# Patient Record
Sex: Female | Born: 1945 | Race: Black or African American | Hispanic: No | Smoking: Never smoker
Health system: Southern US, Community
[De-identification: ages and names within clinical notes are randomized; demographics above are authoritative.]

---
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
## 1988-06-30 HISTORY — PX: BREAST EXCISIONAL BIOPSY: SUR124

## 1994-02-25 HISTORY — PX: BREAST EXCISIONAL BIOPSY: SUR124

## 1998-07-27 ENCOUNTER — Ambulatory Visit (HOSPITAL_BASED_OUTPATIENT_CLINIC_OR_DEPARTMENT_OTHER): Admission: RE | Admit: 1998-07-27 | Discharge: 1998-07-27 | Payer: Self-pay | Admitting: Specialist

## 1998-11-05 ENCOUNTER — Ambulatory Visit (HOSPITAL_COMMUNITY): Admission: RE | Admit: 1998-11-05 | Discharge: 1998-11-05 | Payer: Self-pay | Admitting: Gastroenterology

## 1999-04-11 ENCOUNTER — Encounter: Admission: RE | Admit: 1999-04-11 | Discharge: 1999-04-11 | Payer: Self-pay | Admitting: *Deleted

## 1999-04-15 ENCOUNTER — Encounter: Admission: RE | Admit: 1999-04-15 | Discharge: 1999-04-15 | Payer: Self-pay | Admitting: *Deleted

## 1999-10-14 ENCOUNTER — Encounter: Admission: RE | Admit: 1999-10-14 | Discharge: 1999-10-14 | Payer: Self-pay | Admitting: *Deleted

## 1999-10-14 ENCOUNTER — Encounter: Payer: Self-pay | Admitting: Obstetrics and Gynecology

## 2000-04-21 ENCOUNTER — Encounter: Admission: RE | Admit: 2000-04-21 | Discharge: 2000-04-21 | Payer: Self-pay | Admitting: General Surgery

## 2000-04-21 ENCOUNTER — Encounter (HOSPITAL_BASED_OUTPATIENT_CLINIC_OR_DEPARTMENT_OTHER): Payer: Self-pay | Admitting: General Surgery

## 2001-04-22 ENCOUNTER — Encounter: Payer: Self-pay | Admitting: Family Medicine

## 2001-04-22 ENCOUNTER — Encounter: Admission: RE | Admit: 2001-04-22 | Discharge: 2001-04-22 | Payer: Self-pay | Admitting: Family Medicine

## 2001-06-14 ENCOUNTER — Other Ambulatory Visit: Admission: RE | Admit: 2001-06-14 | Discharge: 2001-06-14 | Payer: Self-pay | Admitting: Family Medicine

## 2001-06-25 ENCOUNTER — Encounter: Payer: Self-pay | Admitting: Family Medicine

## 2001-06-25 ENCOUNTER — Encounter: Admission: RE | Admit: 2001-06-25 | Discharge: 2001-06-25 | Payer: Self-pay | Admitting: Family Medicine

## 2002-04-25 ENCOUNTER — Encounter: Payer: Self-pay | Admitting: Family Medicine

## 2002-04-25 ENCOUNTER — Encounter: Admission: RE | Admit: 2002-04-25 | Discharge: 2002-04-25 | Payer: Self-pay | Admitting: Family Medicine

## 2002-06-15 ENCOUNTER — Other Ambulatory Visit: Admission: RE | Admit: 2002-06-15 | Discharge: 2002-06-15 | Payer: Self-pay | Admitting: Family Medicine

## 2003-04-28 ENCOUNTER — Encounter: Admission: RE | Admit: 2003-04-28 | Discharge: 2003-04-28 | Payer: Self-pay | Admitting: Family Medicine

## 2003-10-02 ENCOUNTER — Other Ambulatory Visit: Admission: RE | Admit: 2003-10-02 | Discharge: 2003-10-02 | Payer: Self-pay | Admitting: Family Medicine

## 2004-05-16 ENCOUNTER — Encounter: Admission: RE | Admit: 2004-05-16 | Discharge: 2004-05-16 | Payer: Self-pay | Admitting: Family Medicine

## 2004-10-17 ENCOUNTER — Other Ambulatory Visit: Admission: RE | Admit: 2004-10-17 | Discharge: 2004-10-17 | Payer: Self-pay | Admitting: Family Medicine

## 2004-10-31 ENCOUNTER — Encounter: Admission: RE | Admit: 2004-10-31 | Discharge: 2004-10-31 | Payer: Self-pay | Admitting: Family Medicine

## 2005-06-26 ENCOUNTER — Encounter: Admission: RE | Admit: 2005-06-26 | Discharge: 2005-06-26 | Payer: Self-pay | Admitting: Family Medicine

## 2006-07-01 ENCOUNTER — Encounter: Admission: RE | Admit: 2006-07-01 | Discharge: 2006-07-01 | Payer: Self-pay | Admitting: Family Medicine

## 2007-07-26 ENCOUNTER — Encounter: Admission: RE | Admit: 2007-07-26 | Discharge: 2007-07-26 | Payer: Self-pay | Admitting: Family Medicine

## 2008-07-28 ENCOUNTER — Encounter: Admission: RE | Admit: 2008-07-28 | Discharge: 2008-07-28 | Payer: Self-pay | Admitting: Family Medicine

## 2009-07-30 ENCOUNTER — Encounter: Admission: RE | Admit: 2009-07-30 | Discharge: 2009-07-30 | Payer: Self-pay | Admitting: Family Medicine

## 2010-07-20 ENCOUNTER — Other Ambulatory Visit: Payer: Self-pay | Admitting: Family Medicine

## 2010-07-20 DIAGNOSIS — Z0000 Encounter for general adult medical examination without abnormal findings: Secondary | ICD-10-CM

## 2010-07-21 ENCOUNTER — Encounter: Payer: Self-pay | Admitting: Family Medicine

## 2010-08-01 ENCOUNTER — Ambulatory Visit
Admission: RE | Admit: 2010-08-01 | Discharge: 2010-08-01 | Disposition: A | Discharge status: Home / Self Care | Payer: 59 | Source: Ambulatory Visit | Attending: Family Medicine | Admitting: Family Medicine

## 2010-08-01 DIAGNOSIS — Z0000 Encounter for general adult medical examination without abnormal findings: Secondary | ICD-10-CM

## 2010-08-01 DIAGNOSIS — Z780 Asymptomatic menopausal state: Secondary | ICD-10-CM

## 2010-08-01 DIAGNOSIS — M8580 Other specified disorders of bone density and structure, unspecified site: Secondary | ICD-10-CM

## 2011-07-04 ENCOUNTER — Other Ambulatory Visit: Payer: Self-pay | Admitting: Family Medicine

## 2011-07-04 DIAGNOSIS — Z1231 Encounter for screening mammogram for malignant neoplasm of breast: Secondary | ICD-10-CM

## 2011-08-01 ENCOUNTER — Other Ambulatory Visit: Payer: Self-pay

## 2011-08-05 ENCOUNTER — Ambulatory Visit
Admission: RE | Admit: 2011-08-05 | Discharge: 2011-08-05 | Disposition: A | Discharge status: Home / Self Care | Payer: BLUE CROSS/BLUE SHIELD | Source: Ambulatory Visit | Attending: Family Medicine | Admitting: Family Medicine

## 2011-08-05 DIAGNOSIS — Z1231 Encounter for screening mammogram for malignant neoplasm of breast: Secondary | ICD-10-CM

## 2012-07-16 ENCOUNTER — Other Ambulatory Visit (HOSPITAL_BASED_OUTPATIENT_CLINIC_OR_DEPARTMENT_OTHER): Payer: Self-pay | Admitting: Family Medicine

## 2012-07-16 ENCOUNTER — Other Ambulatory Visit: Payer: Self-pay | Admitting: Family Medicine

## 2012-07-16 DIAGNOSIS — Z1239 Encounter for other screening for malignant neoplasm of breast: Secondary | ICD-10-CM

## 2012-07-16 DIAGNOSIS — Z1231 Encounter for screening mammogram for malignant neoplasm of breast: Secondary | ICD-10-CM

## 2012-08-05 ENCOUNTER — Ambulatory Visit (HOSPITAL_BASED_OUTPATIENT_CLINIC_OR_DEPARTMENT_OTHER): Payer: BLUE CROSS/BLUE SHIELD

## 2012-08-18 ENCOUNTER — Ambulatory Visit
Admission: RE | Admit: 2012-08-18 | Discharge: 2012-08-18 | Disposition: A | Discharge status: Home / Self Care | Payer: Medicare Other | Source: Ambulatory Visit | Attending: Family Medicine | Admitting: Family Medicine

## 2012-08-18 DIAGNOSIS — Z1231 Encounter for screening mammogram for malignant neoplasm of breast: Secondary | ICD-10-CM

## 2012-12-04 DIAGNOSIS — M5137 Other intervertebral disc degeneration, lumbosacral region: Secondary | ICD-10-CM | POA: Insufficient documentation

## 2012-12-04 DIAGNOSIS — M51379 Other intervertebral disc degeneration, lumbosacral region without mention of lumbar back pain or lower extremity pain: Secondary | ICD-10-CM | POA: Insufficient documentation

## 2012-12-04 DIAGNOSIS — IMO0002 Reserved for concepts with insufficient information to code with codable children: Secondary | ICD-10-CM | POA: Insufficient documentation

## 2012-12-04 DIAGNOSIS — M47817 Spondylosis without myelopathy or radiculopathy, lumbosacral region: Secondary | ICD-10-CM | POA: Insufficient documentation

## 2013-08-09 ENCOUNTER — Other Ambulatory Visit: Payer: Self-pay | Admitting: Family Medicine

## 2013-08-09 DIAGNOSIS — Z1239 Encounter for other screening for malignant neoplasm of breast: Secondary | ICD-10-CM

## 2013-08-26 ENCOUNTER — Ambulatory Visit: Payer: Medicare Other

## 2013-09-09 ENCOUNTER — Ambulatory Visit
Admission: RE | Admit: 2013-09-09 | Discharge: 2013-09-09 | Disposition: A | Discharge status: Home / Self Care | Payer: Medicare HMO | Source: Ambulatory Visit | Attending: Family Medicine | Admitting: Family Medicine

## 2013-09-09 DIAGNOSIS — Z1239 Encounter for other screening for malignant neoplasm of breast: Secondary | ICD-10-CM

## 2013-10-24 ENCOUNTER — Encounter: Payer: Self-pay | Admitting: Podiatry

## 2013-10-24 ENCOUNTER — Ambulatory Visit (INDEPENDENT_AMBULATORY_CARE_PROVIDER_SITE_OTHER): Payer: Medicare HMO | Admitting: Podiatry

## 2013-10-24 VITALS — BP 150/89 | HR 98 | Ht 63.0 in | Wt 182.0 lb

## 2013-10-24 DIAGNOSIS — M722 Plantar fascial fibromatosis: Secondary | ICD-10-CM

## 2013-10-24 DIAGNOSIS — M659 Synovitis and tenosynovitis, unspecified: Secondary | ICD-10-CM

## 2013-10-24 DIAGNOSIS — M7752 Other enthesopathy of left foot: Secondary | ICD-10-CM

## 2013-10-24 DIAGNOSIS — M21969 Unspecified acquired deformity of unspecified lower leg: Secondary | ICD-10-CM

## 2013-10-24 DIAGNOSIS — M6588 Other synovitis and tenosynovitis, other site: Secondary | ICD-10-CM | POA: Insufficient documentation

## 2013-10-24 NOTE — Patient Instructions (Signed)
Seen for pain in left heel and right medial ankle. Noted of weakened first Metatarsal bone. Casted for Orthotics. Use Arch binder during exercise.

## 2013-10-24 NOTE — Progress Notes (Signed)
Subjective: 68 year old female patient presents stating that she believes she has had plantar fasciitis right ( for about a month or longer) that was improved with exercise. Now there is discomfort in left medial ankle, anterior and inferior aspect of left ankle. Meloxicam helped a little and still having pain after Gym exercise. The right heel pain returned now.  Does exercise daily doing cardio, elliptical and others.   Objective: Dermatologic: Normal skin without any lesions. Vascular: All pedal pulses are palpable.  Orthopedic: hypermobile first ray bilateral. HAV with bunion left. S/P Bunion surgery right. Neurologic: All epicritic and tactile sensations grossly intact.  Radiographic examination reveal post bunion surgery with internal fixation pin in place on left bunion site. Noted of elevated first ray bilateral, large plantar heel spur bilateral.  Assessment: 1. Right plantar fasciitis. 2. Posterior tibial tendonitis left. 3. Hypermobile first ray bilateral. 4. HAV with bunion left.   Plan: Reviewed findings and available treatment options.  Metatarsal binder (Med) dispensed bilateral. Both feet casted for orthotics.

## 2013-11-09 ENCOUNTER — Encounter: Payer: Self-pay | Admitting: Podiatry

## 2013-11-09 ENCOUNTER — Ambulatory Visit (INDEPENDENT_AMBULATORY_CARE_PROVIDER_SITE_OTHER): Payer: Medicare HMO | Admitting: Podiatry

## 2013-11-09 VITALS — BP 151/89 | HR 84

## 2013-11-09 DIAGNOSIS — M722 Plantar fascial fibromatosis: Secondary | ICD-10-CM

## 2013-11-09 NOTE — Patient Instructions (Signed)
Orthotic dispensed and reviewed x-ray findings. Return in one month for follow up.

## 2013-11-09 NOTE — Progress Notes (Signed)
Patient came in to pick up orthotics.  Orthotic dispensed with instruction and x-ray findings reviewed. Return in one month.

## 2013-12-13 ENCOUNTER — Ambulatory Visit (INDEPENDENT_AMBULATORY_CARE_PROVIDER_SITE_OTHER): Payer: Medicare HMO | Admitting: Podiatry

## 2013-12-13 ENCOUNTER — Encounter: Payer: Self-pay | Admitting: Podiatry

## 2013-12-13 VITALS — BP 143/80 | HR 76

## 2013-12-13 DIAGNOSIS — M722 Plantar fascial fibromatosis: Secondary | ICD-10-CM

## 2013-12-13 DIAGNOSIS — M21969 Unspecified acquired deformity of unspecified lower leg: Secondary | ICD-10-CM

## 2013-12-13 NOTE — Patient Instructions (Signed)
Seen for continuing pain in right heel while using Orthotics. Ankle brace dispensed. Return as needed.

## 2013-12-13 NOTE — Progress Notes (Signed)
Subjective:  Came in for Orthotic follow up. Left foot feels fine and has no discomfort in ankle area. Right heel still hurt after been on them. Was out in beach and walked a lot.   Objective: Extreme hypermobile first ray right. Pain in right plantar heel.  Assessment: Plantar fasciitis right. Hypermobile first ray right.   Plan: May benefit from Ankle brace to assist medial column till heel pain subside.  Right Ankle brace (medium) dispensed.  Return if problem recur.

## 2014-08-17 ENCOUNTER — Other Ambulatory Visit: Payer: Self-pay

## 2014-08-17 DIAGNOSIS — Z1231 Encounter for screening mammogram for malignant neoplasm of breast: Secondary | ICD-10-CM

## 2014-08-29 ENCOUNTER — Ambulatory Visit (INDEPENDENT_AMBULATORY_CARE_PROVIDER_SITE_OTHER): Payer: Medicare HMO

## 2014-08-29 VITALS — BP 155/89 | HR 79 | Resp 18

## 2014-08-29 DIAGNOSIS — L608 Other nail disorders: Secondary | ICD-10-CM | POA: Diagnosis not present

## 2014-08-29 DIAGNOSIS — B351 Tinea unguium: Secondary | ICD-10-CM

## 2014-08-29 DIAGNOSIS — L603 Nail dystrophy: Secondary | ICD-10-CM

## 2014-08-29 MED ORDER — TAVABOROLE 5 % EX SOLN: CUTANEOUS | Status: DC

## 2014-08-29 NOTE — Patient Instructions (Signed)
Onychomycosis/Fungal Toenails  WHAT IS IT? An infection that lies within the keratin of your nail plate that is caused by a fungus.  WHY ME? Fungal infections affect all ages, sexes, races, and creeds.  There may be many factors that predispose you to a fungal infection such as age, coexisting medical conditions such as diabetes, or an autoimmune disease; stress, medications, fatigue, genetics, etc.  Bottom line: fungus thrives in a warm, moist environment and your shoes offer such a location.  IS IT CONTAGIOUS? Theoretically, yes.  You do not want to share shoes, nail clippers or files with someone who has fungal toenails.  Walking around barefoot in the same room or sleeping in the same bed is unlikely to transfer the organism.  It is important to realize, however, that fungus can spread easily from one nail to the next on the same foot.  HOW DO WE TREAT THIS?  There are several ways to treat this condition.  Treatment may depend on many factors such as age, medications, pregnancy, liver and kidney conditions, etc.  It is best to ask your doctor which options are available to you.  1. No treatment.   Unlike many other medical concerns, you can live with this condition.  However for many people this can be a painful condition and may lead to ingrown toenails or a bacterial infection.  It is recommended that you keep the nails cut short to help reduce the amount of fungal nail. 2. Topical treatment.  These range from herbal remedies to prescription strength nail lacquers.  About 40-50% effective, topicals require twice daily application for approximately 9 to 12 months or until an entirely new nail has grown out.  The most effective topicals are medical grade medications available through physicians offices. 3. Oral antifungal medications.  With an 80-90% cure rate, the most common oral medication requires 3 to 4 months of therapy and stays in your system for a year as the new nail grows out.  Oral  antifungal medications do require blood work to make sure it is a safe drug for you.  A liver function panel will be performed prior to starting the medication and after the first month of treatment.  It is important to have the blood work performed to avoid any harmful side effects.  In general, this medication safe but blood work is required. 4. Laser Therapy.  This treatment is performed by applying a specialized laser to the affected nail plate.  This therapy is noninvasive, fast, and non-painful.  It is not covered by insurance and is therefore, out of pocket.  The results have been very good with a 80-95% cure rate.  The [facility] is the only practice in the area to offer this therapy. 5. Permanent Nail Avulsion.  Removing the entire nail so that a new nail will not grow back.   Jodi GeraldsKerydin has been ordered from [facility]. It will be sent directly to your home. Begin daily application to each affected toenail once daily for 12 months as instructed.

## 2014-08-29 NOTE — Progress Notes (Signed)
   Subjective:    Patient ID: Mertie MooresVirginia C Yeakel, female    DOB: 1946/06/21, 69 y.o.   MRN: [id number]  HPI I HAVE SOME TOENAILS THAT I WOULD LIKE TO HAVE LOOKED AT AND THE GREAT TOENAIL ON MY RIGHT IS THICK AND DISCOLORED AND IS SORE AND TENDER AND THE 5TH TOE IS THICK AND DISCOLORED AND RUBS AGAINST MY SHOES    Review of Systems  All other systems reviewed and are negative.      Objective:   Physical Exam Neurovascular status is intact with pedal pulses palpable DP and PT +2 over 4 Refill time 3 seconds all digits epicritic and proprioceptive sensations intact there is normal plantar response and DTRs. Dermatologically skin color pigment normal hair growth absent there is dark discoloration of nails of the right hallux and fourth toe right foot show some thickening discoloration and darkening consistent with onychomycosis or separation from the nailbed lysis of the nail from the nailbed on the hallux more so than the fourth digit. No history of acute trauma however has had multiple injuries to the nails over the past rubbing against her shoes. No open wounds no ulcers no secondary infections. No ascending psoas lymphangitis noted again pedal pulses palpable epicritic and sensations intact. Patient has a history of previous bunion surgery right foot bunion hammertoe surgery with been successful correction good range of motion clinically radiated clinically patient is good alignment of the toes no significant deformities are noted at this time.      Assessment & Plan:  Assessment this time nail dystrophy slightly onychomycosis of the hallux and fourth digit right foot. Plan at this time nails are cocoa views are obtained for fungal culture KOH and stains at this time will initiate topical antifungal therapy prescription for Jodi GeraldsKerydin [facility]. Patient will initiate daily Kerydin application for 12 months as instructed suggest a 6 month follow-up and recheck.  Alvan Dameichard Kenika Sahm  DPM

## 2014-08-29 NOTE — Addendum Note (Signed)
Addended by: Hadley PenOX, Jemina Scahill R on: 08/29/2014 10:09 AM   Modules accepted: Orders

## 2014-09-12 ENCOUNTER — Ambulatory Visit
Admission: RE | Admit: 2014-09-12 | Discharge: 2014-09-12 | Disposition: A | Discharge status: Home / Self Care | Payer: Medicare HMO | Source: Ambulatory Visit

## 2014-09-12 DIAGNOSIS — Z1231 Encounter for screening mammogram for malignant neoplasm of breast: Secondary | ICD-10-CM

## 2014-10-05 DIAGNOSIS — J302 Other seasonal allergic rhinitis: Secondary | ICD-10-CM | POA: Insufficient documentation

## 2014-11-08 DIAGNOSIS — Z0000 Encounter for general adult medical examination without abnormal findings: Secondary | ICD-10-CM | POA: Insufficient documentation

## 2014-11-08 DIAGNOSIS — R3129 Other microscopic hematuria: Secondary | ICD-10-CM | POA: Insufficient documentation

## 2014-12-08 DIAGNOSIS — N952 Postmenopausal atrophic vaginitis: Secondary | ICD-10-CM | POA: Insufficient documentation

## 2014-12-08 DIAGNOSIS — N281 Cyst of kidney, acquired: Secondary | ICD-10-CM | POA: Insufficient documentation

## 2014-12-25 ENCOUNTER — Other Ambulatory Visit: Payer: Self-pay

## 2015-03-06 ENCOUNTER — Ambulatory Visit: Payer: Medicare HMO

## 2015-03-06 ENCOUNTER — Ambulatory Visit: Payer: Medicare HMO | Admitting: Podiatry

## 2015-08-07 ENCOUNTER — Other Ambulatory Visit: Payer: Self-pay

## 2015-08-07 DIAGNOSIS — Z1231 Encounter for screening mammogram for malignant neoplasm of breast: Secondary | ICD-10-CM

## 2015-09-13 ENCOUNTER — Ambulatory Visit
Admission: RE | Admit: 2015-09-13 | Discharge: 2015-09-13 | Disposition: A | Discharge status: Home / Self Care | Payer: Medicare HMO | Source: Ambulatory Visit

## 2015-09-13 DIAGNOSIS — Z1231 Encounter for screening mammogram for malignant neoplasm of breast: Secondary | ICD-10-CM

## 2016-08-05 ENCOUNTER — Other Ambulatory Visit: Payer: Self-pay | Admitting: Family Medicine

## 2016-08-05 DIAGNOSIS — Z1231 Encounter for screening mammogram for malignant neoplasm of breast: Secondary | ICD-10-CM

## 2016-09-15 ENCOUNTER — Ambulatory Visit: Payer: Medicare HMO

## 2016-09-17 ENCOUNTER — Ambulatory Visit
Admission: RE | Admit: 2016-09-17 | Discharge: 2016-09-17 | Disposition: A | Discharge status: Home / Self Care | Payer: Medicare HMO | Source: Ambulatory Visit | Attending: Family Medicine | Admitting: Family Medicine

## 2016-09-17 DIAGNOSIS — Z1231 Encounter for screening mammogram for malignant neoplasm of breast: Secondary | ICD-10-CM

## 2016-10-01 DIAGNOSIS — R739 Hyperglycemia, unspecified: Secondary | ICD-10-CM | POA: Insufficient documentation

## 2017-08-20 ENCOUNTER — Other Ambulatory Visit: Payer: Self-pay | Admitting: Family Medicine

## 2017-08-20 DIAGNOSIS — Z139 Encounter for screening, unspecified: Secondary | ICD-10-CM

## 2017-09-18 ENCOUNTER — Ambulatory Visit
Admission: RE | Admit: 2017-09-18 | Discharge: 2017-09-18 | Disposition: A | Discharge status: Home / Self Care | Payer: Medicare HMO | Source: Ambulatory Visit | Attending: Family Medicine | Admitting: Family Medicine

## 2017-09-18 DIAGNOSIS — Z139 Encounter for screening, unspecified: Secondary | ICD-10-CM

## 2017-12-16 DIAGNOSIS — S92302A Fracture of unspecified metatarsal bone(s), left foot, initial encounter for closed fracture: Secondary | ICD-10-CM | POA: Insufficient documentation

## 2018-01-11 DIAGNOSIS — M21612 Bunion of left foot: Secondary | ICD-10-CM | POA: Insufficient documentation

## 2018-07-27 ENCOUNTER — Encounter: Payer: Self-pay | Admitting: Podiatry

## 2018-07-27 ENCOUNTER — Ambulatory Visit (INDEPENDENT_AMBULATORY_CARE_PROVIDER_SITE_OTHER): Payer: Medicare HMO

## 2018-07-27 ENCOUNTER — Other Ambulatory Visit: Payer: Self-pay | Admitting: Podiatry

## 2018-07-27 ENCOUNTER — Ambulatory Visit: Payer: Medicare HMO | Admitting: Podiatry

## 2018-07-27 VITALS — BP 167/86 | HR 67 | Resp 16

## 2018-07-27 DIAGNOSIS — M2041 Other hammer toe(s) (acquired), right foot: Secondary | ICD-10-CM

## 2018-07-27 DIAGNOSIS — Q828 Other specified congenital malformations of skin: Secondary | ICD-10-CM

## 2018-07-27 DIAGNOSIS — M2042 Other hammer toe(s) (acquired), left foot: Principal | ICD-10-CM

## 2018-07-28 NOTE — Progress Notes (Signed)
  Subjective:  Patient ID: Kendra Jenkins, female    DOB: [birth year],  MRN: [id number] HPI Chief Complaint  Patient presents with  . Toe Pain    2nd toe right - callused area plantar toe x 1-2 weeks, tender, no treatment, previous hammer toe surgery  . New Patient (Initial Visit)    Est pt 2016    73 y.o. female presents with the above complaint.   ROS: Denies fever chills nausea vomiting muscle aches pains calf pain back pain chest pain shortness of breath.  No past medical history on file. Past Surgical History:  Procedure Laterality Date  . BREAST EXCISIONAL BIOPSY Bilateral 02/25/1994  . BREAST EXCISIONAL BIOPSY Left 1990  . BREAST EXCISIONAL BIOPSY Right 1963    Current Outpatient Medications:  .  aspirin EC 81 MG tablet, Take 81 mg by mouth daily. , Disp: , Rfl:  .  Biotin 1 MG CAPS, 5 mg daily. , Disp: , Rfl:  .  Cholecalciferol (VITAMIN D3) 2000 UNITS capsule, Take by mouth., Disp: , Rfl:  .  hydrochlorothiazide (HYDRODIURIL) 12.5 MG tablet, , Disp: , Rfl:  .  losartan (COZAAR) 50 MG tablet, , Disp: , Rfl:   No Known Allergies Review of Systems Objective:   Vitals:   07/27/18 0943  BP: (!) 167/86  Pulse: 67  Resp: 16    General: Well developed, nourished, in no acute distress, alert and oriented x3   Dermatological: Skin is warm, dry and supple bilateral. Nails x 10 are well maintained; remaining integument appears unremarkable at this time. There are no open sores, no preulcerative lesions, no rash or signs of infection present.  A pinch callus resulting from under lapping of the third toe.  The calluses on the second toe lateral aspect with a small porokeratotic lesion.  Does not demonstrate any type of foreign body no purulence no malodor no iatrogenic lesions.  Vascular: Dorsalis Pedis artery and Posterior Tibial artery pedal pulses are 2/4 bilateral with immedate capillary fill time. Pedal hair growth present. No varicosities and no lower extremity edema  present bilateral.   Neruologic: Grossly intact via light touch bilateral. Vibratory intact via tuning fork bilateral. Protective threshold with Semmes Wienstein monofilament intact to all pedal sites bilateral. Patellar and Achilles deep tendon reflexes 2+ bilateral. No Babinski or clonus noted bilateral.   Musculoskeletal: No gross boney pedal deformities bilateral. No pain, crepitus, or limitation noted with foot and ankle range of motion bilateral. Muscular strength 5/5 in all groups tested bilateral.  Gait: Unassisted, Nonantalgic.    Radiographs:  Radiographs taken demonstrate mild hammertoe deformity underlapping of the second toe from the third toe on the right foot.  No acute findings.  Assessment & Plan:   Assessment: A small pinch callus from the third toe on the second toe resulting in a porokeratotic lesion.  Plan: Debrided the reactive hyperkeratotic lesion placed silicone padding discussed appropriate shoe gear with her.  Discussed the possible need for surgical correction of the third fourth and fifth toe.     Kendra Jenkins, [place]

## 2018-08-23 ENCOUNTER — Other Ambulatory Visit: Payer: Self-pay | Admitting: Family Medicine

## 2018-08-23 DIAGNOSIS — Z1231 Encounter for screening mammogram for malignant neoplasm of breast: Secondary | ICD-10-CM

## 2018-09-23 ENCOUNTER — Ambulatory Visit: Payer: Medicare HMO

## 2018-11-03 ENCOUNTER — Ambulatory Visit: Payer: Medicare HMO

## 2018-12-17 ENCOUNTER — Ambulatory Visit
Admission: RE | Admit: 2018-12-17 | Discharge: 2018-12-17 | Disposition: A | Discharge status: Home / Self Care | Payer: Medicare HMO | Source: Ambulatory Visit | Attending: Family Medicine | Admitting: Family Medicine

## 2018-12-17 ENCOUNTER — Other Ambulatory Visit: Payer: Self-pay

## 2018-12-17 DIAGNOSIS — Z1231 Encounter for screening mammogram for malignant neoplasm of breast: Secondary | ICD-10-CM

## 2019-11-15 ENCOUNTER — Other Ambulatory Visit: Payer: Self-pay | Admitting: Family Medicine

## 2019-11-15 DIAGNOSIS — Z1231 Encounter for screening mammogram for malignant neoplasm of breast: Secondary | ICD-10-CM

## 2019-12-20 ENCOUNTER — Other Ambulatory Visit: Payer: Self-pay

## 2019-12-20 ENCOUNTER — Ambulatory Visit
Admission: RE | Admit: 2019-12-20 | Discharge: 2019-12-20 | Disposition: A | Discharge status: Home / Self Care | Payer: Medicare HMO | Source: Ambulatory Visit | Attending: Family Medicine | Admitting: Family Medicine

## 2019-12-20 DIAGNOSIS — Z1231 Encounter for screening mammogram for malignant neoplasm of breast: Secondary | ICD-10-CM

## 2020-11-15 ENCOUNTER — Other Ambulatory Visit: Payer: Self-pay | Admitting: Family Medicine

## 2020-11-15 DIAGNOSIS — Z1231 Encounter for screening mammogram for malignant neoplasm of breast: Secondary | ICD-10-CM

## 2021-01-15 ENCOUNTER — Other Ambulatory Visit: Payer: Self-pay

## 2021-01-15 ENCOUNTER — Ambulatory Visit
Admission: RE | Admit: 2021-01-15 | Discharge: 2021-01-15 | Disposition: A | Discharge status: Home / Self Care | Payer: Medicare HMO | Source: Ambulatory Visit | Attending: Family Medicine | Admitting: Family Medicine

## 2021-01-15 DIAGNOSIS — Z1231 Encounter for screening mammogram for malignant neoplasm of breast: Secondary | ICD-10-CM

## 2021-05-17 IMAGING — MG DIGITAL SCREENING BILATERAL MAMMOGRAM WITH TOMO AND CAD
8 series · 8 of 24 positions shown · non-contrast
Comparison: Previous exam(s).

CLINICAL DATA: Screening.

EXAM:
DIGITAL SCREENING BILATERAL MAMMOGRAM WITH TOMO AND CAD

[R CC synth-2D]
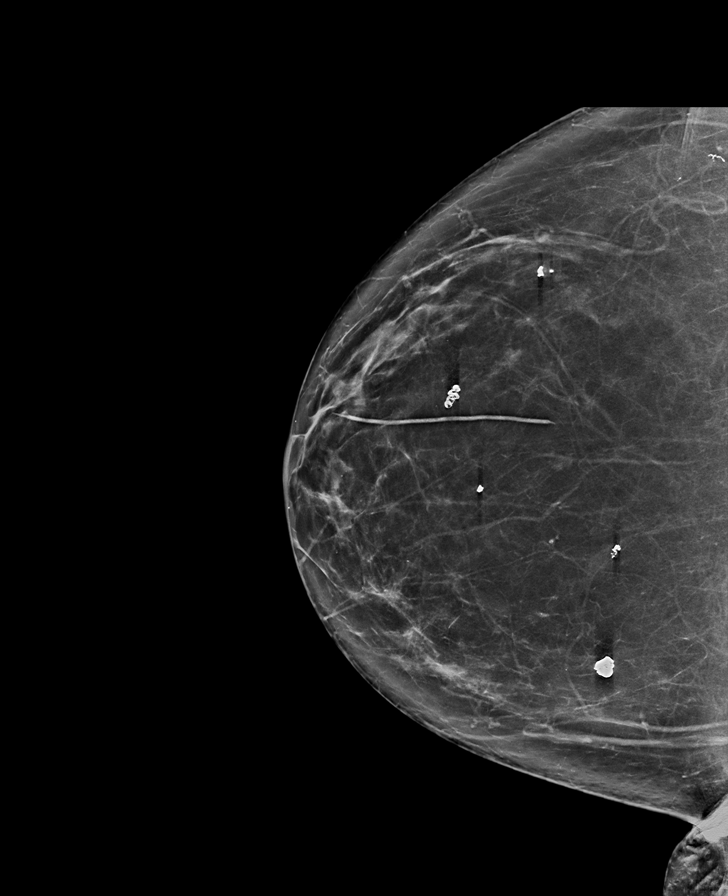

[L CC synth-2D]
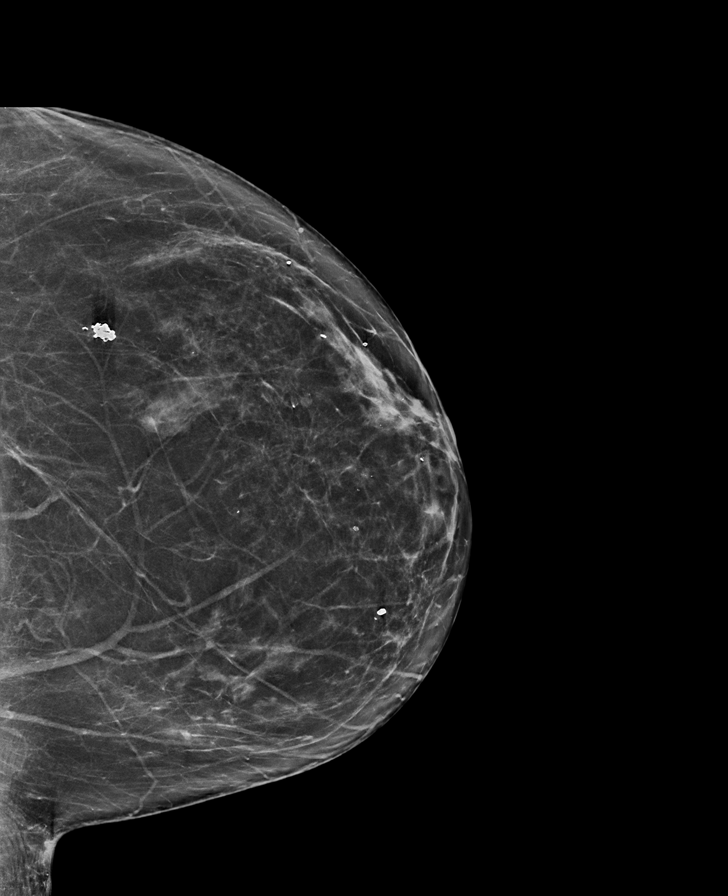

[L MLO synth-2D]
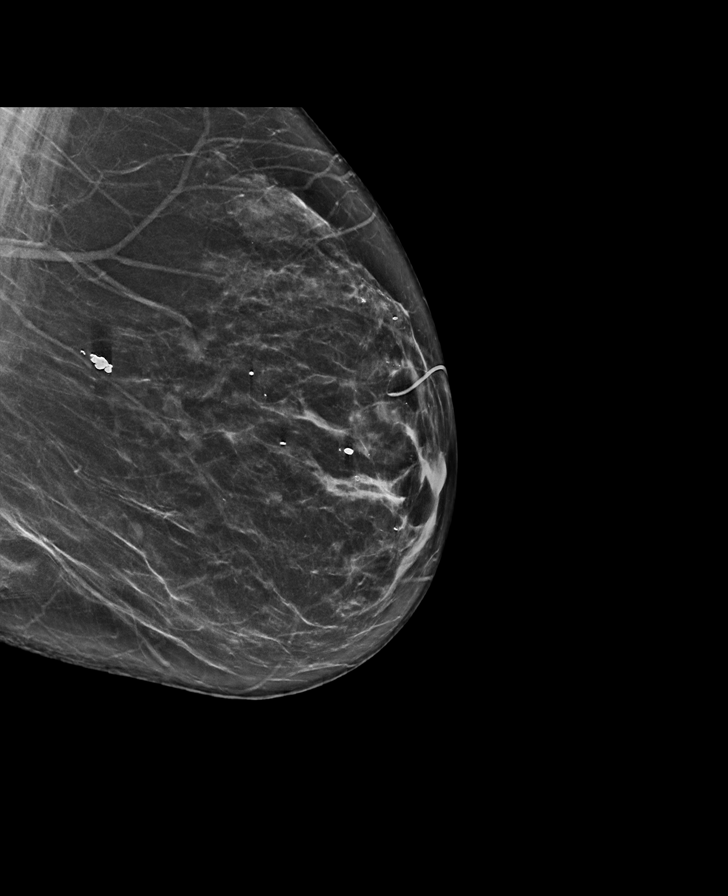

[R MLO synth-2D]
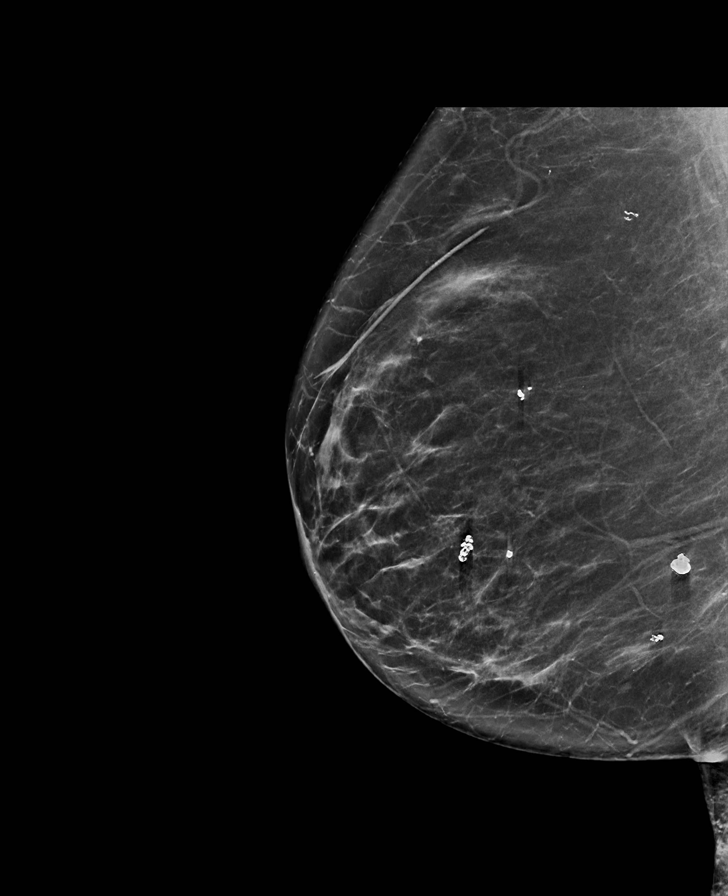

[R CC tomo · tomo slice 41/81.0]
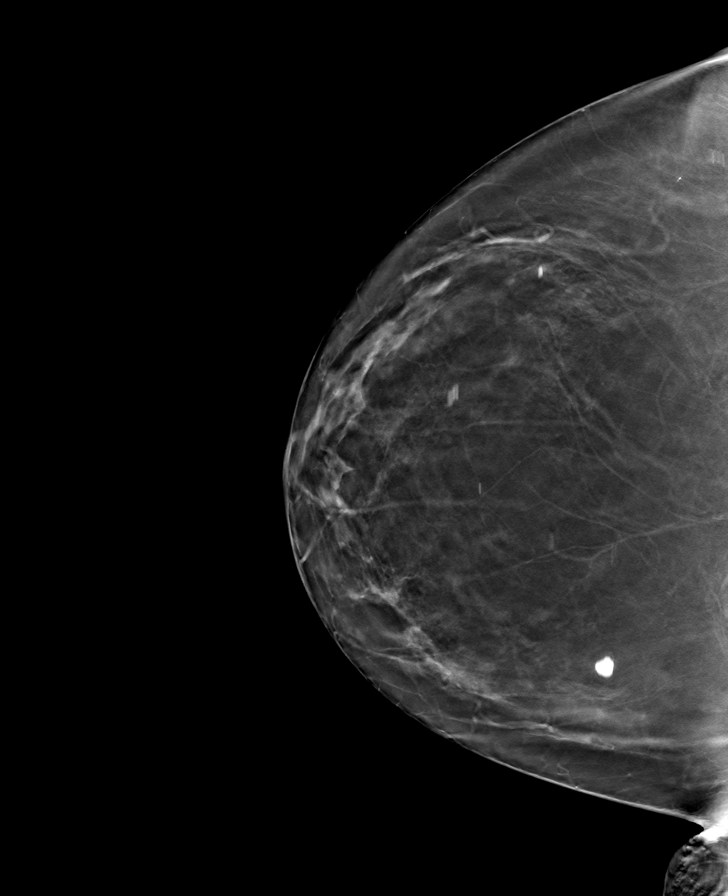

[L MLO tomo · tomo slice 39/76.0]
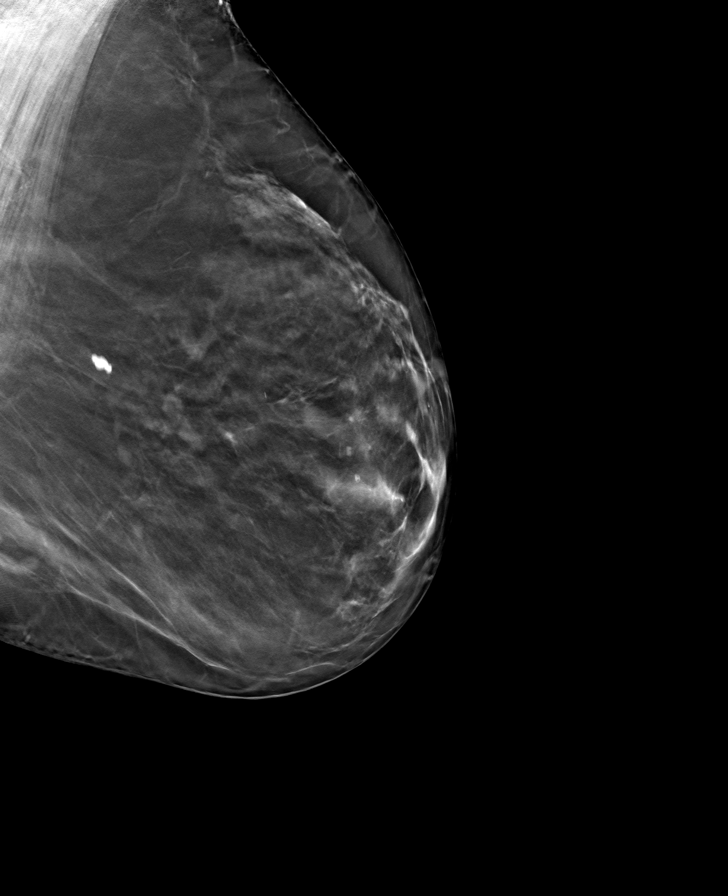

[R MLO tomo · tomo slice 41/80.0]
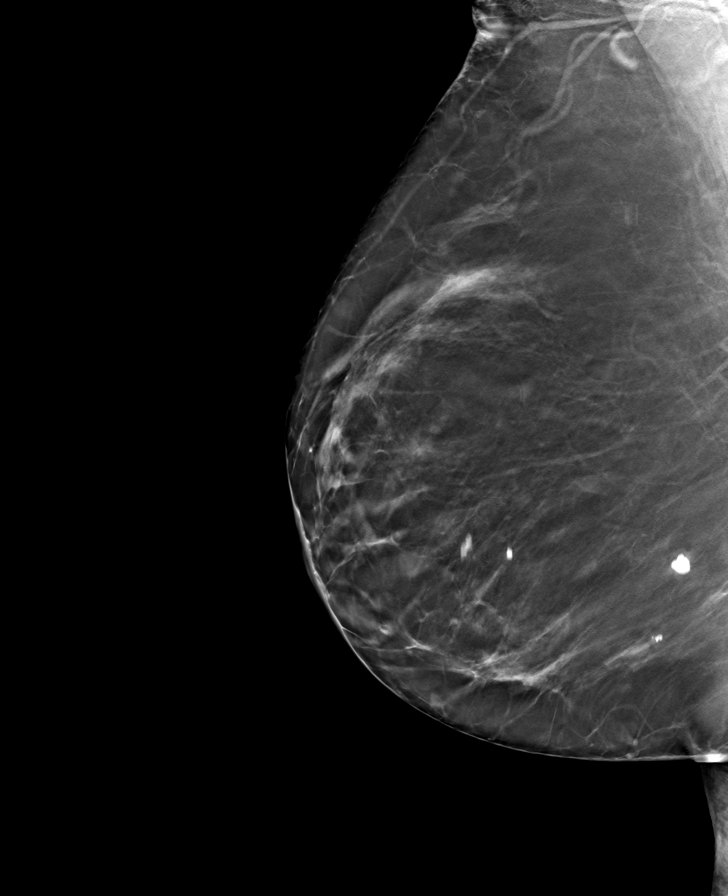

[L CC tomo · tomo slice 37/73.0]
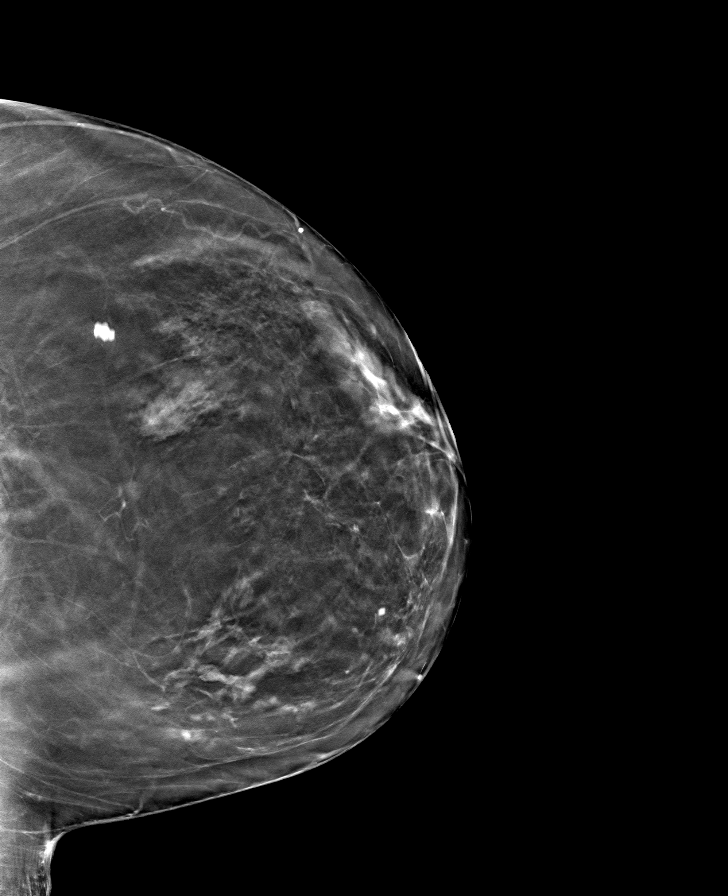

[8 of 24 positions shown; findings below may reference images not displayed]

ACR Breast Density Category b: There are scattered areas of
fibroglandular density.
FINDINGS: There are no findings suspicious for malignancy. Images were
processed with CAD.
IMPRESSION: No mammographic evidence of malignancy. A result letter of this
screening mammogram will be mailed directly to the patient.

RECOMMENDATION:
Screening mammogram in one year. (Code:CN-U-775)

BI-RADS CATEGORY  1: Negative.

## 2021-05-21 ENCOUNTER — Emergency Department (HOSPITAL_BASED_OUTPATIENT_CLINIC_OR_DEPARTMENT_OTHER)
Admission: EM | Admit: 2021-05-21 | Discharge: 2021-05-21 | Disposition: A | Discharge status: Home / Self Care | Payer: Medicare HMO | Attending: Emergency Medicine | Admitting: Emergency Medicine

## 2021-05-21 ENCOUNTER — Encounter (HOSPITAL_BASED_OUTPATIENT_CLINIC_OR_DEPARTMENT_OTHER): Payer: Self-pay

## 2021-05-21 ENCOUNTER — Other Ambulatory Visit: Payer: Self-pay

## 2021-05-21 DIAGNOSIS — Z7982 Long term (current) use of aspirin: Secondary | ICD-10-CM | POA: Diagnosis not present

## 2021-05-21 DIAGNOSIS — I10 Essential (primary) hypertension: Secondary | ICD-10-CM | POA: Diagnosis not present

## 2021-05-21 DIAGNOSIS — S0181XA Laceration without foreign body of other part of head, initial encounter: Secondary | ICD-10-CM | POA: Diagnosis not present

## 2021-05-21 DIAGNOSIS — Z79899 Other long term (current) drug therapy: Secondary | ICD-10-CM | POA: Insufficient documentation

## 2021-05-21 DIAGNOSIS — Z23 Encounter for immunization: Secondary | ICD-10-CM | POA: Diagnosis not present

## 2021-05-21 DIAGNOSIS — W268XXA Contact with other sharp object(s), not elsewhere classified, initial encounter: Secondary | ICD-10-CM | POA: Insufficient documentation

## 2021-05-21 MED ORDER — LIDOCAINE-EPINEPHRINE-TETRACAINE (LET) TOPICAL GEL
3.0000 mL | Freq: Once | TOPICAL | Status: AC
  Administered 2021-05-21: 3 mL via TOPICAL
  Filled 2021-05-21: qty 3

## 2021-05-21 MED ORDER — TETANUS-DIPHTH-ACELL PERTUSSIS 5-2.5-18.5 LF-MCG/0.5 IM SUSY
0.5000 mL | PREFILLED_SYRINGE | Freq: Once | INTRAMUSCULAR | Status: AC
  Administered 2021-05-21: 0.5 mL via INTRAMUSCULAR
  Filled 2021-05-21: qty 0.5

## 2021-05-21 NOTE — ED Triage Notes (Addendum)
Pt states she was reaching towards night stand this am-"bumped into it"-puncture wound/lac and bruising to left side of face-denies LOC-NAD-steady gait

## 2021-05-21 NOTE — Discharge Instructions (Signed)
WOUND CARE Please have your stitches/staples removed in 5 days or sooner if you have concerns. You may do this at any available urgent care or at your primary care doctor's office.  Keep area clean and dry for 24 hours. Do not remove bandage, if applied.  After 24 hours, remove bandage and wash wound gently with mild soap and warm water. Reapply a new bandage after cleaning wound, if directed.  Continue daily cleansing with soap and water until stitches/staples are removed.  Do not apply any ointments or creams to the wound while stitches/staples are in place, as this may cause delayed healing.  Seek medical careif you experience any of the following signs of infection: Swelling, redness, pus drainage, streaking, fever >101.0 F  Seek care if you experience excessive bleeding that does not stop after 15-20 minutes of constant, firm pressure.    

## 2021-05-21 NOTE — ED Provider Notes (Signed)
[facility] Provider Note   CSN: [id number] Arrival date & time: 05/21/21  1451     History Chief Complaint  Patient presents with   Facial Injury    Kendra Jenkins is a 75 y.o. female.  HPI 75 year old female presents to the emergency department today for evaluation of laceration to her left cheek.  Patient reports that this morning she was bending over to get something from her nightstand and she caused a laceration to her left cheek by the corner of the bedside table.  Patient denies any loss of consciousness.  No headache.  No significant pain.  Last tetanus shot was in 2012.  Patient denies any vision changes.  Patient does not take blood thinner except for baby aspirin.    History reviewed. No pertinent past medical history.  Patient Active Problem List   Diagnosis Date Noted   Bunion of left foot 01/11/2018   Closed avulsion fracture of metatarsal bone of left foot 12/16/2017   Hyperglycemia 10/01/2016   Acquired complex cyst of kidney 12/08/2014   Vaginal atrophy 12/08/2014   Other microscopic hematuria 11/08/2014   Preventative health care 11/08/2014   Seasonal allergies 10/05/2014   Plantar fasciitis, right 10/24/2013   Tendonitis of ankle, left 10/24/2013   Metatarsal deformity 10/24/2013   Other synovitis and tenosynovitis, other site 10/24/2013   DDD (degenerative disc disease), lumbosacral 12/04/2012   Lumbosacral spondylosis 12/04/2012   Thoracic or lumbosacral neuritis or radiculitis 12/04/2012   Hematuria 04/14/2010   Mixed hyperlipidemia 03/31/2010   Disorder of bone and cartilage 03/20/2010   Hypertension, benign 03/20/2010   Pain in soft tissues of limb 03/20/2010   Palpitations 03/20/2010   Vitamin D deficiency 03/20/2010   Displacement of lumbar intervertebral disc 01/10/2008    Past Surgical History:  Procedure Laterality Date   BREAST EXCISIONAL BIOPSY Bilateral 02/25/1994   BREAST EXCISIONAL BIOPSY Left  1990   BREAST EXCISIONAL BIOPSY Right 1963     OB History   No obstetric history on file.     Family History  Problem Relation Age of Onset   Breast cancer Sister     Social History   Tobacco Use   Smoking status: Never   Smokeless tobacco: Never  Vaping Use   Vaping Use: Never used  Substance Use Topics   Alcohol use: Never   Drug use: Never    Home Medications Prior to Admission medications   Medication Sig Start Date End Date Taking? Authorizing Provider  aspirin EC 81 MG tablet Take 81 mg by mouth daily.     [provider]  Biotin 1 MG CAPS 5 mg daily.     [provider]  Cholecalciferol (VITAMIN D3) 2000 UNITS capsule Take by mouth.    [provider]  hydrochlorothiazide (HYDRODIURIL) 12.5 MG tablet  07/14/18   [provider]  losartan (COZAAR) 50 MG tablet  07/14/18   [provider]    Allergies    Patient has no known allergies.  Review of Systems   Review of Systems  Constitutional:  Negative for chills and fever.  HENT:  Negative for congestion.   Eyes:  Negative for photophobia and visual disturbance.  Musculoskeletal:  Negative for arthralgias and myalgias.  Skin:  Positive for wound.  Neurological:  Negative for syncope and headaches.  Psychiatric/Behavioral:  Negative for confusion.    Physical Exam Updated Vital Signs BP (!) 161/80 (BP Location: Left Arm)   Pulse (!) 102   Temp  99 F (37.2 C) (Oral)   Resp 18   Ht 5\' 4"  (1.626 m)   Wt 73 kg   SpO2 98%   BMI 27.64 kg/m   Physical Exam Vitals and nursing note reviewed.  Constitutional:      General: She is not in acute distress.    Appearance: She is well-developed. She is not ill-appearing or toxic-appearing.  HENT:     Head: Normocephalic and atraumatic.     Comments: Patient with approximately 2 similar laceration over the left cheek.  Bleeding is controlled.  There is ecchymosis and edema around the left orbit.  There is no proptosis.   EOMs are intact.  No pain with palpation over this area. Eyes:     General: No scleral icterus.       Right eye: No discharge.        Left eye: No discharge.  Pulmonary:     Effort: No respiratory distress.  Musculoskeletal:        General: Normal range of motion.     Cervical back: Normal range of motion.  Skin:    Coloration: Skin is not pale.  Neurological:     Mental Status: She is alert and oriented to person, place, and time.  Psychiatric:        Mood and Affect: Mood normal.        Behavior: Behavior normal.        Thought Content: Thought content normal.        Judgment: Judgment normal.    ED Results / Procedures / Treatments   Labs (all labs ordered are listed, but only abnormal results are displayed) Labs Reviewed - No data to display  EKG None  Radiology No results found.  Procedures .Marland KitchenLaceration Repair  Date/Time: 05/21/2021 5:09 PM Performed by: Doristine Devoid, PA-C Authorized by: Doristine Devoid, PA-C   Consent:    Consent obtained:  Verbal   Consent given by:  Patient   Risks discussed:  Infection, pain, poor cosmetic result and need for additional repair Universal protocol:    Patient identity confirmed:  Verbally with patient Laceration details:    Location:  Face   Length (cm):  2   Depth (mm):  2 Treatment:    Area cleansed with:  Povidone-iodine   Amount of cleaning:  Standard   Irrigation solution:  Sterile water   Irrigation method:  Tap   Visualized foreign bodies/material removed: no   Skin repair:    Repair method:  Sutures   Suture size:  6-0   Suture material:  Prolene   Suture technique:  Simple interrupted   Number of sutures:  3 Approximation:    Approximation:  Close Repair type:    Repair type:  Simple Post-procedure details:    Dressing:  Non-adherent dressing   Procedure completion:  Tolerated   Medications Ordered in ED Medications  lidocaine-EPINEPHrine-tetracaine (LET) topical gel (has no  administration in time range)    ED Course  I have reviewed the triage vital signs and the nursing notes.  Pertinent labs & imaging results that were available during my care of the patient were reviewed by me and considered in my medical decision making (see chart for details).    MDM Rules/Calculators/A&P                           75 year old female presents the ER today for evaluation of laceration to her left cheek.  Patient tetanus shot updated in the ER.  She is not on blood thinners.  Low suspicion for fracture.  Low sufficient for intracranial hemorrhage.  Laceration was appeared as above.  Patient educated on wound care at home and primary care follow-up.  Discussed reasons to return to the ER. Final Clinical Impression(s) / ED Diagnoses Final diagnoses:  Facial laceration, initial encounter    Rx / DC Orders ED Discharge Orders     None        Wallace Keller 05/21/21 1711    Derwood Kaplan, MD 05/23/21 [phone]

## 2021-05-26 ENCOUNTER — Encounter (HOSPITAL_BASED_OUTPATIENT_CLINIC_OR_DEPARTMENT_OTHER): Payer: Self-pay | Admitting: *Deleted

## 2021-05-26 ENCOUNTER — Emergency Department (HOSPITAL_BASED_OUTPATIENT_CLINIC_OR_DEPARTMENT_OTHER)
Admission: EM | Admit: 2021-05-26 | Discharge: 2021-05-26 | Disposition: A | Discharge status: Home / Self Care | Payer: Medicare HMO | Attending: Emergency Medicine | Admitting: Emergency Medicine

## 2021-05-26 ENCOUNTER — Other Ambulatory Visit: Payer: Self-pay

## 2021-05-26 DIAGNOSIS — Z7982 Long term (current) use of aspirin: Secondary | ICD-10-CM | POA: Diagnosis not present

## 2021-05-26 DIAGNOSIS — I10 Essential (primary) hypertension: Secondary | ICD-10-CM | POA: Diagnosis not present

## 2021-05-26 DIAGNOSIS — Z4802 Encounter for removal of sutures: Secondary | ICD-10-CM | POA: Diagnosis present

## 2021-05-26 DIAGNOSIS — Z79899 Other long term (current) drug therapy: Secondary | ICD-10-CM | POA: Insufficient documentation

## 2021-05-26 NOTE — ED Provider Notes (Signed)
[facility] EMERGENCY DEPARTMENT Provider Note   CSN: [id number] Arrival date & time: 05/26/21  1740     History Chief Complaint  Patient presents with   Suture / Staple Removal    Kendra Jenkins is a 75 y.o. female.  Presents to ER for suture removal.  Sutures placed 5 days ago.  States that things seem to be healing well.  She has no concerns for infection.  No other medical complaints today.  Per review of chart patient was here on 11/22 for facial laceration, 3 sutures placed.  HPI     History reviewed. No pertinent past medical history.  Patient Active Problem List   Diagnosis Date Noted   Bunion of left foot 01/11/2018   Closed avulsion fracture of metatarsal bone of left foot 12/16/2017   Hyperglycemia 10/01/2016   Acquired complex cyst of kidney 12/08/2014   Vaginal atrophy 12/08/2014   Other microscopic hematuria 11/08/2014   Preventative health care 11/08/2014   Seasonal allergies 10/05/2014   Plantar fasciitis, right 10/24/2013   Tendonitis of ankle, left 10/24/2013   Metatarsal deformity 10/24/2013   Other synovitis and tenosynovitis, other site 10/24/2013   DDD (degenerative disc disease), lumbosacral 12/04/2012   Lumbosacral spondylosis 12/04/2012   Thoracic or lumbosacral neuritis or radiculitis 12/04/2012   Hematuria 04/14/2010   Mixed hyperlipidemia 03/31/2010   Disorder of bone and cartilage 03/20/2010   Hypertension, benign 03/20/2010   Pain in soft tissues of limb 03/20/2010   Palpitations 03/20/2010   Vitamin D deficiency 03/20/2010   Displacement of lumbar intervertebral disc 01/10/2008    Past Surgical History:  Procedure Laterality Date   BREAST EXCISIONAL BIOPSY Bilateral 02/25/1994   BREAST EXCISIONAL BIOPSY Left 1990   BREAST EXCISIONAL BIOPSY Right 1963     OB History   No obstetric history on file.     Family History  Problem Relation Age of Onset   Breast cancer Sister     Social History   Tobacco Use    Smoking status: Never   Smokeless tobacco: Never  Vaping Use   Vaping Use: Never used  Substance Use Topics   Alcohol use: Never   Drug use: Never    Home Medications Prior to Admission medications   Medication Sig Start Date End Date Taking? Authorizing Provider  aspirin EC 81 MG tablet Take 81 mg by mouth daily.     [provider]  Biotin 1 MG CAPS 5 mg daily.     [provider]  Cholecalciferol (VITAMIN D3) 2000 UNITS capsule Take by mouth.    [provider]  hydrochlorothiazide (HYDRODIURIL) 12.5 MG tablet  07/14/18   [provider]  losartan (COZAAR) 50 MG tablet  07/14/18   [provider]    Allergies    Patient has no known allergies.  Review of Systems   Review of Systems  All other systems reviewed and are negative.  Physical Exam Updated Vital Signs BP (!) 169/76 (BP Location: Right Arm)   Temp 98.2 F (36.8 C) (Oral)   Resp 18   SpO2 100%   Physical Exam Vitals and nursing note reviewed.  Constitutional:      Appearance: Normal appearance.  HENT:     Head: Normocephalic.     Comments: 3 cm to centimeter laceration to left face is healing well, 3 sutures are intact    Nose: Nose normal.  Eyes:     Pupils: Pupils are equal, round, and reactive to light.  Cardiovascular:  Rate and Rhythm: Normal rate.     Pulses: Normal pulses.  Pulmonary:     Effort: Pulmonary effort is normal. No respiratory distress.  Musculoskeletal:        General: No deformity or signs of injury.     Cervical back: Normal range of motion. No rigidity.  Skin:    General: Skin is warm.     Capillary Refill: Capillary refill takes less than 2 seconds.  Neurological:     General: No focal deficit present.     Mental Status: She is alert and oriented to person, place, and time.  Psychiatric:        Mood and Affect: Mood normal.        Thought Content: Thought content normal.    ED Results / Procedures / Treatments    Labs (all labs ordered are listed, but only abnormal results are displayed) Labs Reviewed - No data to display  EKG None  Radiology No results found.  Procedures .Suture Removal  Date/Time: 05/26/2021 7:33 AM Performed by: Lucrezia Starch, MD Authorized by: Lucrezia Starch, MD   Consent:    Consent obtained:  Verbal   Consent given by:  Patient   Risks, benefits, and alternatives were discussed: yes     Risks discussed:  Bleeding   Alternatives discussed:  No treatment, delayed treatment, alternative treatment, observation and referral Universal protocol:    Immediately prior to procedure, a time out was called: yes     Patient identity confirmed:  Verbally with patient Location:    Location:  [place] location:  [place] location:  L cheek Procedure details:    Wound appearance:  Good wound healing, no signs of infection and clean   Number of sutures removed:  3 Post-procedure details:    Procedure completion:  Tolerated well, no immediate complications   Medications Ordered in ED Medications - No data to display  ED Course  I have reviewed the triage vital signs and the nursing notes.  Pertinent labs & imaging results that were available during my care of the patient were reviewed by me and considered in my medical decision making (see chart for details).    MDM Rules/Calculators/A&P                          75 year old lady here for suture removal.  Facial laceration appears to be healing well.  Remove sutures.  Discharged.   After the discussed management above, the patient was determined to be safe for discharge.  The patient was in agreement with this plan and all questions regarding their care were answered.  ED return precautions were discussed and the patient will return to the ED with any significant worsening of condition.  Final Clinical Impression(s) / ED Diagnoses Final diagnoses:  Visit for suture removal    Rx / DC  Orders ED Discharge Orders     None        Lucrezia Starch, MD 05/26/21 [phone]

## 2021-05-26 NOTE — Discharge Instructions (Signed)
Keep area clean and dry, follow-up with your primary care doctor as needed.  If you develop redness, swelling, drainage, come back to ER for reassessment.

## 2021-05-26 NOTE — ED Triage Notes (Signed)
Here for facial suture removal and wound ck

## 2021-05-26 NOTE — ED Notes (Signed)
Returns for facial suture removal, denies any pain, redness, swelling, fevers at the site. ED MD at bedside

## 2021-12-19 ENCOUNTER — Other Ambulatory Visit: Payer: Self-pay | Admitting: Family Medicine

## 2021-12-19 DIAGNOSIS — Z1231 Encounter for screening mammogram for malignant neoplasm of breast: Secondary | ICD-10-CM

## 2022-01-21 ENCOUNTER — Ambulatory Visit
Admission: RE | Admit: 2022-01-21 | Discharge: 2022-01-21 | Disposition: A | Discharge status: Home / Self Care | Payer: Medicare HMO | Source: Ambulatory Visit | Attending: Family Medicine | Admitting: Family Medicine

## 2022-01-21 DIAGNOSIS — Z1231 Encounter for screening mammogram for malignant neoplasm of breast: Secondary | ICD-10-CM
